# Patient Record
Sex: Female | Born: 2000 | Race: Black or African American | Hispanic: No | Marital: Single | Smoking: Never smoker
Health system: Southern US, Community
[De-identification: ages and names within clinical notes are randomized; demographics above are authoritative.]

## PROBLEM LIST (undated history)

## (undated) ENCOUNTER — Inpatient Hospital Stay (HOSPITAL_COMMUNITY): Payer: Self-pay

## (undated) DIAGNOSIS — O24419 Gestational diabetes mellitus in pregnancy, unspecified control: Secondary | ICD-10-CM

## (undated) DIAGNOSIS — Z789 Other specified health status: Secondary | ICD-10-CM

## (undated) DIAGNOSIS — A749 Chlamydial infection, unspecified: Secondary | ICD-10-CM

## (undated) DIAGNOSIS — N390 Urinary tract infection, site not specified: Secondary | ICD-10-CM

## (undated) DIAGNOSIS — O039 Complete or unspecified spontaneous abortion without complication: Secondary | ICD-10-CM

## (undated) DIAGNOSIS — D649 Anemia, unspecified: Secondary | ICD-10-CM

## (undated) HISTORY — PX: NO PAST SURGERIES: SHX2092

## (undated) HISTORY — DX: Other specified health status: Z78.9

---
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
## 2022-10-09 ENCOUNTER — Other Ambulatory Visit: Payer: Self-pay

## 2022-10-09 DIAGNOSIS — Z3A00 Weeks of gestation of pregnancy not specified: Secondary | ICD-10-CM | POA: Diagnosis not present

## 2022-10-09 DIAGNOSIS — O98319 Other infections with a predominantly sexual mode of transmission complicating pregnancy, unspecified trimester: Secondary | ICD-10-CM | POA: Diagnosis not present

## 2022-10-09 DIAGNOSIS — Z202 Contact with and (suspected) exposure to infections with a predominantly sexual mode of transmission: Secondary | ICD-10-CM | POA: Insufficient documentation

## 2022-10-09 DIAGNOSIS — A749 Chlamydial infection, unspecified: Secondary | ICD-10-CM | POA: Diagnosis not present

## 2022-10-09 DIAGNOSIS — O26899 Other specified pregnancy related conditions, unspecified trimester: Secondary | ICD-10-CM | POA: Diagnosis present

## 2022-10-09 LAB — POC URINE PREG, ED: Preg Test, Ur: POSITIVE — AB

## 2022-10-09 NOTE — ED Triage Notes (Signed)
Pt reports concern for STD and is requesting screening. Denies discharge or bleeding. Pt also reports + pregnancy test at home. Pt ambulatory to triage. Alert and oriented following commands. Breathing unlabored speaking in full sentences with symmetric chest rise and fall.

## 2022-10-09 NOTE — ED Notes (Signed)
Pt chart lists that she has a legal guardian and pt confirms. Pt chart does not detail who her legal guardian is. Pt states that it is her mother and confirms the phone number listed in the chart is correct.

## 2022-10-10 ENCOUNTER — Emergency Department
Admission: EM | Admit: 2022-10-10 | Discharge: 2022-10-10 | Disposition: A | Discharge status: Home / Self Care | Payer: Medicaid Other | Attending: Emergency Medicine | Admitting: Emergency Medicine

## 2022-10-10 DIAGNOSIS — Z3490 Encounter for supervision of normal pregnancy, unspecified, unspecified trimester: Secondary | ICD-10-CM

## 2022-10-10 DIAGNOSIS — A749 Chlamydial infection, unspecified: Secondary | ICD-10-CM

## 2022-10-10 DIAGNOSIS — Z202 Contact with and (suspected) exposure to infections with a predominantly sexual mode of transmission: Secondary | ICD-10-CM

## 2022-10-10 MED ORDER — LIDOCAINE HCL (PF) 1 % IJ SOLN
1.0000 mL | Freq: Once | INTRAMUSCULAR | Status: AC
  Administered 2022-10-10: 1 mL
  Filled 2022-10-10: qty 5

## 2022-10-10 MED ORDER — AZITHROMYCIN 500 MG PO TABS
1000.0000 mg | ORAL_TABLET | Freq: Once | ORAL | Status: AC
  Administered 2022-10-10: 1000 mg via ORAL
  Filled 2022-10-10: qty 2

## 2022-10-10 MED ORDER — CEFTRIAXONE SODIUM 500 MG IJ SOLR
500.0000 mg | Freq: Once | INTRAMUSCULAR | Status: AC
  Administered 2022-10-10: 500 mg via INTRAMUSCULAR
  Filled 2022-10-10: qty 500

## 2022-10-10 NOTE — Discharge Instructions (Addendum)
Call your gynecologist for a follow-up appointment to establish care for this pregnancy.  If you are unable to connect with your old gynecologist you may call Dr. Valentino Saxon to establish care at our [facility] clinic.  Avoid having sex for 7 days.  Inform all sexual partners you have had in the last 60 days to get tested for STDs and treated as needed.  Thank you for choosing [place] for your health care today!  Please see your primary doctor this week for a follow up appointment.   If you have any new, worsening, or unexpected symptoms call your doctor right away or come back to the emergency department for reevaluation.  It was my pleasure to care for you today.   Daneil Dan Modesto Charon, MD
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
## 2023-03-09 ENCOUNTER — Emergency Department (HOSPITAL_COMMUNITY): Payer: Medicaid Other

## 2023-03-09 ENCOUNTER — Encounter (HOSPITAL_COMMUNITY): Payer: Self-pay

## 2023-03-09 ENCOUNTER — Inpatient Hospital Stay (HOSPITAL_COMMUNITY)
Admission: EM | Admit: 2023-03-09 | Discharge: 2023-03-10 | Disposition: A | Discharge status: Home / Self Care | Payer: Medicaid Other | Attending: Family Medicine | Admitting: Family Medicine

## 2023-03-09 ENCOUNTER — Other Ambulatory Visit: Payer: Self-pay

## 2023-03-09 DIAGNOSIS — O9A212 Injury, poisoning and certain other consequences of external causes complicating pregnancy, second trimester: Secondary | ICD-10-CM | POA: Diagnosis not present

## 2023-03-09 DIAGNOSIS — S0990XA Unspecified injury of head, initial encounter: Secondary | ICD-10-CM | POA: Diagnosis not present

## 2023-03-09 DIAGNOSIS — O321XX0 Maternal care for breech presentation, not applicable or unspecified: Secondary | ICD-10-CM | POA: Diagnosis not present

## 2023-03-09 DIAGNOSIS — O26892 Other specified pregnancy related conditions, second trimester: Secondary | ICD-10-CM | POA: Diagnosis present

## 2023-03-09 DIAGNOSIS — Y92410 Unspecified street and highway as the place of occurrence of the external cause: Secondary | ICD-10-CM | POA: Diagnosis not present

## 2023-03-09 DIAGNOSIS — O4702 False labor before 37 completed weeks of gestation, second trimester: Secondary | ICD-10-CM | POA: Diagnosis not present

## 2023-03-09 DIAGNOSIS — Z3A26 26 weeks gestation of pregnancy: Secondary | ICD-10-CM | POA: Insufficient documentation

## 2023-03-09 DIAGNOSIS — R0602 Shortness of breath: Secondary | ICD-10-CM | POA: Insufficient documentation

## 2023-03-09 DIAGNOSIS — Z711 Person with feared health complaint in whom no diagnosis is made: Secondary | ICD-10-CM | POA: Insufficient documentation

## 2023-03-09 DIAGNOSIS — S0003XA Contusion of scalp, initial encounter: Secondary | ICD-10-CM | POA: Insufficient documentation

## 2023-03-09 DIAGNOSIS — O4700 False labor before 37 completed weeks of gestation, unspecified trimester: Secondary | ICD-10-CM | POA: Diagnosis not present

## 2023-03-09 LAB — CBC WITH DIFFERENTIAL/PLATELET
Abs Immature Granulocytes: 0.08 10*3/uL — ABNORMAL HIGH (ref 0.00–0.07)
Basophils Absolute: 0 10*3/uL (ref 0.0–0.1)
Basophils Relative: 0 %
Eosinophils Absolute: 0.1 10*3/uL (ref 0.0–0.5)
Eosinophils Relative: 1 %
HCT: 31.3 % — ABNORMAL LOW (ref 36.0–46.0)
Hemoglobin: 10.5 g/dL — ABNORMAL LOW (ref 12.0–15.0)
Immature Granulocytes: 1 %
Lymphocytes Relative: 8 %
Lymphs Abs: 0.9 10*3/uL (ref 0.7–4.0)
MCH: 28.4 pg (ref 26.0–34.0)
MCHC: 33.5 g/dL (ref 30.0–36.0)
MCV: 84.6 fL (ref 80.0–100.0)
Monocytes Absolute: 0.7 10*3/uL (ref 0.1–1.0)
Monocytes Relative: 6 %
Neutro Abs: 9.3 10*3/uL — ABNORMAL HIGH (ref 1.7–7.7)
Neutrophils Relative %: 84 %
Platelets: 226 10*3/uL (ref 150–400)
RBC: 3.7 MIL/uL — ABNORMAL LOW (ref 3.87–5.11)
RDW: 18.6 % — ABNORMAL HIGH (ref 11.5–15.5)
WBC: 11 10*3/uL — ABNORMAL HIGH (ref 4.0–10.5)
nRBC: 0 % (ref 0.0–0.2)

## 2023-03-09 LAB — COMPREHENSIVE METABOLIC PANEL
ALT: 25 U/L (ref 0–44)
AST: 58 U/L — ABNORMAL HIGH (ref 15–41)
Albumin: 3.3 g/dL — ABNORMAL LOW (ref 3.5–5.0)
Alkaline Phosphatase: 60 U/L (ref 38–126)
Anion gap: 11 (ref 5–15)
BUN: 7 mg/dL (ref 6–20)
CO2: 20 mmol/L — ABNORMAL LOW (ref 22–32)
Calcium: 8.8 mg/dL — ABNORMAL LOW (ref 8.9–10.3)
Chloride: 103 mmol/L (ref 98–111)
Creatinine, Ser: 0.52 mg/dL (ref 0.44–1.00)
GFR, Estimated: >60 mL/min (ref >60)
Glucose, Bld: 86 mg/dL (ref 70–99)
Potassium: 3.2 mmol/L — ABNORMAL LOW (ref 3.5–5.1)
Sodium: 134 mmol/L — ABNORMAL LOW (ref 135–145)
Total Bilirubin: 0.8 mg/dL (ref 0.0–1.2)
Total Protein: 7.1 g/dL (ref 6.5–8.1)

## 2023-03-09 MED ORDER — ONDANSETRON HCL 4 MG/2ML IJ SOLN
4.0000 mg | Freq: Once | INTRAMUSCULAR | Status: AC
  Administered 2023-03-09: 4 mg via INTRAVENOUS
  Filled 2023-03-09: qty 2

## 2023-03-09 MED ORDER — SODIUM CHLORIDE 0.9 % IV BOLUS
1000.0000 mL | Freq: Once | INTRAVENOUS | Status: AC
Start: 2023-03-09 — End: 2023-03-09
  Administered 2023-03-09: 1000 mL via INTRAVENOUS

## 2023-03-09 MED ORDER — MORPHINE SULFATE (PF) 4 MG/ML IV SOLN
4.0000 mg | Freq: Once | INTRAVENOUS | Status: AC
  Administered 2023-03-09: 4 mg via INTRAVENOUS
  Filled 2023-03-09: qty 1

## 2023-03-09 MED ORDER — POTASSIUM CHLORIDE CRYS ER 20 MEQ PO TBCR
40.0000 meq | EXTENDED_RELEASE_TABLET | Freq: Once | ORAL | Status: AC
  Administered 2023-03-09: 40 meq via ORAL
  Filled 2023-03-09: qty 2
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
## 2023-03-09 NOTE — ED Notes (Signed)
 Pt connected back to toco and fetal heart rate monitor. OB rapid response nurse called and notified. Pt being monitored by OB staff.

## 2023-03-09 NOTE — ED Notes (Signed)
 Nurse called OB rapid response nurse. Updated on receiving OB and that the pt is contracting roughly every three minutes.

## 2023-03-09 NOTE — ED Notes (Signed)
 Patient transported to CT

## 2023-03-09 NOTE — ED Notes (Signed)
 Pt connected to toco and fetal heart rate monitor
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
## 2023-03-10 ENCOUNTER — Encounter (HOSPITAL_COMMUNITY): Payer: Self-pay | Admitting: *Deleted

## 2023-03-10 ENCOUNTER — Inpatient Hospital Stay (HOSPITAL_BASED_OUTPATIENT_CLINIC_OR_DEPARTMENT_OTHER): Payer: No Typology Code available for payment source

## 2023-03-10 DIAGNOSIS — O9A212 Injury, poisoning and certain other consequences of external causes complicating pregnancy, second trimester: Secondary | ICD-10-CM | POA: Diagnosis not present

## 2023-03-10 DIAGNOSIS — Z711 Person with feared health complaint in whom no diagnosis is made: Secondary | ICD-10-CM | POA: Diagnosis not present

## 2023-03-10 DIAGNOSIS — S0990XA Unspecified injury of head, initial encounter: Secondary | ICD-10-CM

## 2023-03-10 DIAGNOSIS — O321XX0 Maternal care for breech presentation, not applicable or unspecified: Secondary | ICD-10-CM | POA: Diagnosis not present

## 2023-03-10 DIAGNOSIS — O4700 False labor before 37 completed weeks of gestation, unspecified trimester: Secondary | ICD-10-CM | POA: Diagnosis not present

## 2023-03-10 DIAGNOSIS — O26892 Other specified pregnancy related conditions, second trimester: Secondary | ICD-10-CM

## 2023-03-10 DIAGNOSIS — R0602 Shortness of breath: Secondary | ICD-10-CM | POA: Diagnosis not present

## 2023-03-10 DIAGNOSIS — O4702 False labor before 37 completed weeks of gestation, second trimester: Secondary | ICD-10-CM | POA: Diagnosis not present

## 2023-03-10 DIAGNOSIS — R109 Unspecified abdominal pain: Secondary | ICD-10-CM

## 2023-03-10 DIAGNOSIS — Z3A26 26 weeks gestation of pregnancy: Secondary | ICD-10-CM

## 2023-03-10 DIAGNOSIS — Y92410 Unspecified street and highway as the place of occurrence of the external cause: Secondary | ICD-10-CM | POA: Diagnosis not present

## 2023-03-10 DIAGNOSIS — S0003XA Contusion of scalp, initial encounter: Secondary | ICD-10-CM

## 2023-03-10 MED ORDER — CYCLOBENZAPRINE HCL 5 MG PO TABS
10.0000 mg | ORAL_TABLET | Freq: Once | ORAL | Status: AC
  Administered 2023-03-10: 10 mg via ORAL
  Filled 2023-03-10: qty 2

## 2023-03-10 MED ORDER — MORPHINE SULFATE (PF) 4 MG/ML IV SOLN
4.0000 mg | Freq: Once | INTRAVENOUS | Status: AC
  Administered 2023-03-10: 4 mg via INTRAVENOUS
  Filled 2023-03-10: qty 1
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
## 2023-03-10 NOTE — Progress Notes (Signed)
 Spoke with Dr Shawnie Pons regarding patient and complaints.  Advised to have patient transferred to MAU for further evaluation once cleared medically.

## 2023-03-10 NOTE — MAU Note (Signed)
 Pt arrived via [facility] transport. Report received. Pt alert and oriented and in no apparent acute distress.
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
## 2023-03-10 NOTE — Progress Notes (Signed)
 Spoke with [facility] RN and advised of Dr Virl Cagey recommendation for transfer.

## 2023-03-10 NOTE — ED Notes (Signed)
 Care handoff given to MAU staff at [facility] and [place] [facility]

## 2023-03-10 NOTE — Progress Notes (Signed)
 Patient to be transported to MAU for further evaluation

## 2023-03-10 NOTE — MAU Note (Signed)
 Tele psyche interview completed and patient cleared to be discharged to home
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
## 2023-03-10 NOTE — MAU Note (Signed)
 Pt reports feeling cramping in uterus LR continues at bolus rate

## 2023-03-10 NOTE — MAU Note (Signed)
 Pt requesting help with transportation to [facility] to be with 21 year old son. House coverage called for information on resources-will reach out to social work for possible options when they arrive to work after 8 am., Will offer pt breakfast. Pt has remained alert and oriented throughout shift with no new complaints of pain or disability. Swelling and bruising over L eye /occiput unchanged.  Pt denies any changes in vision-dizziness or feeling light headed with ambulation.
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
## 2024-01-10 ENCOUNTER — Emergency Department (HOSPITAL_COMMUNITY): Admission: EM | Admit: 2024-01-10 | Discharge: 2024-01-10 | Disposition: A | Discharge status: Home / Self Care

## 2024-01-10 ENCOUNTER — Other Ambulatory Visit: Payer: Self-pay

## 2024-01-10 DIAGNOSIS — N390 Urinary tract infection, site not specified: Secondary | ICD-10-CM | POA: Diagnosis not present

## 2024-01-10 DIAGNOSIS — R350 Frequency of micturition: Secondary | ICD-10-CM | POA: Diagnosis present

## 2024-01-10 DIAGNOSIS — R110 Nausea: Secondary | ICD-10-CM | POA: Insufficient documentation

## 2024-01-10 LAB — URINALYSIS, ROUTINE W REFLEX MICROSCOPIC
Bilirubin Urine: NEGATIVE
Glucose, UA: NEGATIVE mg/dL
Ketones, ur: NEGATIVE mg/dL
Nitrite: NEGATIVE
Protein, ur: 100 mg/dL — AB
Specific Gravity, Urine: 1.031 — ABNORMAL HIGH (ref 1.005–1.030)
WBC, UA: >50 WBC/hpf (ref 0–5)
pH: 6 (ref 5.0–8.0)

## 2024-01-10 LAB — PREGNANCY, URINE: Preg Test, Ur: NEGATIVE

## 2024-01-10 MED ORDER — ONDANSETRON HCL 4 MG PO TABS: 4.0000 mg | ORAL_TABLET | Freq: Four times a day (QID) | ORAL | 0 refills | Status: AC

## 2024-01-10 MED ORDER — CEPHALEXIN 500 MG PO CAPS: 500.0000 mg | ORAL_CAPSULE | Freq: Four times a day (QID) | ORAL | 0 refills | Status: AC

## 2024-01-10 MED ORDER — CEFTRIAXONE SODIUM 500 MG IJ SOLR
500.0000 mg | Freq: Once | INTRAMUSCULAR | Status: AC
  Administered 2024-01-10: 500 mg via INTRAMUSCULAR
  Filled 2024-01-10: qty 500

## 2024-01-10 MED ORDER — DOXYCYCLINE HYCLATE 100 MG PO CAPS: 100.0000 mg | ORAL_CAPSULE | Freq: Two times a day (BID) | ORAL | 0 refills | Status: AC

## 2024-01-10 MED ORDER — DOXYCYCLINE HYCLATE 100 MG PO TABS
100.0000 mg | ORAL_TABLET | Freq: Once | ORAL | Status: AC
  Administered 2024-01-10: 100 mg via ORAL
  Filled 2024-01-10: qty 1

## 2024-01-10 MED ORDER — LIDOCAINE HCL (PF) 1 % IJ SOLN
1.0000 mL | Freq: Once | INTRAMUSCULAR | Status: AC
  Administered 2024-01-10: 1 mL
  Filled 2024-01-10: qty 5

## 2024-01-10 MED ORDER — CEPHALEXIN 500 MG PO CAPS
500.0000 mg | ORAL_CAPSULE | Freq: Once | ORAL | Status: AC
  Administered 2024-01-10: 500 mg via ORAL
  Filled 2024-01-10: qty 1

## 2024-01-10 MED ORDER — ONDANSETRON 8 MG PO TBDP
8.0000 mg | ORAL_TABLET | Freq: Once | ORAL | Status: AC
  Administered 2024-01-10: 8 mg via ORAL
  Filled 2024-01-10: qty 1

## 2024-01-10 NOTE — ED Notes (Signed)
 Pt states last BM was 2 days ago. Doesn't use bathroom much because she doesn't eat a lot. Pt would like to get tested for STDs and UTI

## 2024-01-10 NOTE — ED Triage Notes (Signed)
 BIB EMS from home. Pt complains of nausea and vomiting on and off for 8 months. Denies any vomiting today. But has had issues with nausea and vomiting for her entire life. Pt states lately has had decreased appetite.   EMS VS  132/74 103 hr 98% ra  98.4 temp  Cbg 128 4mg  oral zofran  given

## 2024-01-10 NOTE — Discharge Instructions (Addendum)
 Evaluation today revealed that you may have a UTI.  I am starting on Keflex .  For your nausea I sent Zofran  to your pharmacy as well to be taken as needed.  Please follow-up with your PCP.  If you develop a fever, abdominal pain, nausea vomiting diarrhea or any other concerning symptom please return to the ED for further evaluation.  Also STD panel testing was sent.  Please follow-up on your MyChart for the results.  If you are positive please advise your sexual partner partners that they can receive free treatment at the health department.  You have been treated empirically here for gonorrhea and chlamydia.  I sent doxycycline to your pharmacy.

## 2024-01-10 NOTE — ED Provider Notes (Cosign Needed Addendum)
 [place] EMERGENCY DEPARTMENT AT [facility] Provider Note   CSN: [id number] Arrival date & time: 01/10/24  1218     Patient presents with: Nausea  HPI Vanessa Krueger is a 23 y.o. female presenting for nausea she states that is been going on 8 months that she had her daughter.  She states that she is here because it is going on too long and she wanted to be evaluated.  Denies vomiting or diarrhea.  Had a normal bowel movement yesterday.  Does report that she has had some malodorous urine and may be increased urinary frequency here recently.  Denies abdominal pain.  Denies recent use of marijuana products.   HPI     Prior to Admission medications   Medication Sig Start Date End Date Taking? Authorizing Provider  cephALEXin  (KEFLEX ) 500 MG capsule Take 1 capsule (500 mg total) by mouth 4 (four) times daily. 01/10/24  Yes Keamber Macfadden K, PA-C  doxycycline (VIBRAMYCIN) 100 MG capsule Take 1 capsule (100 mg total) by mouth 2 (two) times daily. 01/10/24  Yes Fenix Rorke K, PA-C  ondansetron  (ZOFRAN ) 4 MG tablet Take 1 tablet (4 mg total) by mouth every 6 (six) hours. 01/10/24  Yes Lang Norleen POUR, PA-C  ferrous sulfate  325 (65 FE) MG tablet Take 1 tablet (325 mg total) by mouth every other day. 06/22/21   Signa Delon LABOR, NP  Prenatal MV & Min w/FA-DHA (PRENATAL ADULT GUMMY/DHA/FA) 0.4-25 MG CHEW 1 tablet by mouth daily 06/22/21   Signa Delon LABOR, NP  promethazine  (PHENERGAN ) 25 MG tablet Take 25 mg by mouth every 6 (six) hours as needed for nausea or vomiting.    [provider]    Allergies: Patient has no known allergies.    Review of Systems See HPI  Updated Vital Signs BP 131/84   Pulse 96   Temp 98.4 F (36.9 C) (Oral)   Resp 17   Ht 5' 1 (1.549 m)   Wt 56.7 kg   LMP  (LMP Unknown)   SpO2 100%   BMI 23.62 kg/m   Physical Exam Vitals and nursing note reviewed.  HENT:     Head: Normocephalic and atraumatic.     Mouth/Throat:      Mouth: Mucous membranes are moist.  Eyes:     General:        Right eye: No discharge.        Left eye: No discharge.     Conjunctiva/sclera: Conjunctivae normal.  Cardiovascular:     Rate and Rhythm: Normal rate and regular rhythm.     Pulses: Normal pulses.     Heart sounds: Normal heart sounds.  Pulmonary:     Effort: Pulmonary effort is normal.     Breath sounds: Normal breath sounds.  Abdominal:     General: Abdomen is flat. There is no distension.     Palpations: Abdomen is soft.     Tenderness: There is no abdominal tenderness.  Skin:    General: Skin is warm and dry.  Neurological:     General: No focal deficit present.  Psychiatric:        Mood and Affect: Mood normal.     (all labs ordered are listed, but only abnormal results are displayed) Labs Reviewed  URINALYSIS, ROUTINE W REFLEX MICROSCOPIC - Abnormal; Notable for the following components:      Result Value   APPearance CLOUDY (*)    Specific Gravity, Urine 1.031 (*)    Hgb urine dipstick  SMALL (*)    Protein, ur 100 (*)    Leukocytes,Ua MODERATE (*)    Bacteria, UA FEW (*)    All other components within normal limits  PREGNANCY, URINE  RPR  HIV ANTIBODY (ROUTINE TESTING W REFLEX)  GC/CHLAMYDIA PROBE AMP () NOT AT [facility]    EKG: None  Radiology: No results found.   Procedures   Medications Ordered in the ED  cefTRIAXone  (ROCEPHIN ) injection 500 mg (has no administration in time range)  lidocaine  (PF) (XYLOCAINE ) 1 % injection 1-2.1 mL (has no administration in time range)  doxycycline (VIBRA-TABS) tablet 100 mg (has no administration in time range)  ondansetron  (ZOFRAN -ODT) disintegrating tablet 8 mg (8 mg Oral Given 01/10/24 1311)  cephALEXin  (KEFLEX ) capsule 500 mg (500 mg Oral Given 01/10/24 1411)                                    Medical Decision Making Amount and/or Complexity of Data Reviewed Labs: ordered.  Risk Prescription drug management.   23 year old  well-appearing female presenting for nausea and malodorous urine.  Exam was unremarkable.  DDx includes pregnancy, intra-abdominal infection, CHS, UTI, other.  Overall she is well-appearing, nontoxic, normal vital signs and benign exam.  Given her urinary symptoms and urinalysis is also concerning we will go ahead and treat her for UTI.  Advised her to follow-up with her PCP.  Discussed return precautions.  Fluid challenge with no issue.   At discharge, patient requested STD evaluation.  Sent those labs and treated her empirically for gonorrhea and chlamydia.  Advised to follow-up on MyChart for results and to advise her sexual partner partners accordingly.  Discharged.     Final diagnoses:  Nausea  Urinary tract infection with hematuria, site unspecified    ED Discharge Orders          Ordered    ondansetron  (ZOFRAN ) 4 MG tablet  Every 6 hours        01/10/24 1350    cephALEXin  (KEFLEX ) 500 MG capsule  4 times daily        01/10/24 1407    doxycycline (VIBRAMYCIN) 100 MG capsule  2 times daily        01/10/24 1415              Jaan Fischel K, PA-C 01/10/24 1417    Kammerer, Megan L, DO 01/11/24 [id number]

## 2024-01-11 LAB — SYPHILIS: RPR W/REFLEX TO RPR TITER AND TREPONEMAL ANTIBODIES, TRADITIONAL SCREENING AND DIAGNOSIS ALGORITHM: RPR Ser Ql: NONREACTIVE

## 2024-01-11 LAB — HIV ANTIBODY (ROUTINE TESTING W REFLEX): HIV Screen 4th Generation wRfx: NONREACTIVE

## 2024-01-12 LAB — GC/CHLAMYDIA PROBE AMP (~~LOC~~) NOT AT ARMC
Chlamydia: NEGATIVE
Comment: NEGATIVE
Comment: NORMAL
Neisseria Gonorrhea: NEGATIVE
